# Patient Record
Sex: Female | Born: 1975 | Race: White | Hispanic: No | Smoking: Current every day smoker
Health system: Southern US, Community
[De-identification: ages and names within clinical notes are randomized; demographics above are authoritative.]

## PROBLEM LIST (undated history)

## (undated) DIAGNOSIS — J449 Chronic obstructive pulmonary disease, unspecified: Secondary | ICD-10-CM

## (undated) DIAGNOSIS — Z720 Tobacco use: Secondary | ICD-10-CM

## (undated) HISTORY — PX: ABDOMINAL HYSTERECTOMY: SUR658

---

## 2018-03-30 ENCOUNTER — Ambulatory Visit: Payer: Self-pay | Admitting: Osteopathic Medicine

## 2018-04-12 ENCOUNTER — Ambulatory Visit: Payer: Self-pay | Admitting: Osteopathic Medicine

## 2018-04-12 ENCOUNTER — Telehealth: Payer: Self-pay | Admitting: Osteopathic Medicine

## 2018-04-12 NOTE — Telephone Encounter (Signed)
Patient canceled same day on previous new to establish visit on 03/30/2018.  Today also no-show to establish care with Dr. Lyn Hollingshead, 04/12/18. If late or no-show again, will not accept patient to this clinic. Please call her to reschedule visit and inform them of this policy.

## 2018-04-25 ENCOUNTER — Ambulatory Visit: Payer: Self-pay | Admitting: Osteopathic Medicine

## 2018-05-03 ENCOUNTER — Ambulatory Visit: Payer: Self-pay | Admitting: Osteopathic Medicine

## 2018-08-09 ENCOUNTER — Ambulatory Visit: Payer: Self-pay | Admitting: Osteopathic Medicine

## 2020-06-23 ENCOUNTER — Encounter (HOSPITAL_COMMUNITY): Payer: Self-pay | Admitting: Pediatrics

## 2020-06-23 ENCOUNTER — Inpatient Hospital Stay (HOSPITAL_COMMUNITY)
Admission: EM | Admit: 2020-06-23 | Discharge: 2020-06-26 | DRG: 193 | Disposition: A | Discharge status: Home Health | Payer: Self-pay | Attending: Family Medicine | Admitting: Family Medicine

## 2020-06-23 ENCOUNTER — Emergency Department (HOSPITAL_COMMUNITY): Payer: Self-pay

## 2020-06-23 ENCOUNTER — Other Ambulatory Visit: Payer: Self-pay

## 2020-06-23 DIAGNOSIS — F17210 Nicotine dependence, cigarettes, uncomplicated: Secondary | ICD-10-CM | POA: Diagnosis present

## 2020-06-23 DIAGNOSIS — R0602 Shortness of breath: Secondary | ICD-10-CM

## 2020-06-23 DIAGNOSIS — J9601 Acute respiratory failure with hypoxia: Secondary | ICD-10-CM | POA: Diagnosis present

## 2020-06-23 DIAGNOSIS — Z20822 Contact with and (suspected) exposure to covid-19: Secondary | ICD-10-CM | POA: Diagnosis present

## 2020-06-23 DIAGNOSIS — R0789 Other chest pain: Secondary | ICD-10-CM | POA: Diagnosis present

## 2020-06-23 DIAGNOSIS — Z720 Tobacco use: Secondary | ICD-10-CM

## 2020-06-23 DIAGNOSIS — J101 Influenza due to other identified influenza virus with other respiratory manifestations: Principal | ICD-10-CM | POA: Diagnosis present

## 2020-06-23 DIAGNOSIS — J441 Chronic obstructive pulmonary disease with (acute) exacerbation: Secondary | ICD-10-CM | POA: Diagnosis present

## 2020-06-23 DIAGNOSIS — R079 Chest pain, unspecified: Secondary | ICD-10-CM

## 2020-06-23 DIAGNOSIS — Z825 Family history of asthma and other chronic lower respiratory diseases: Secondary | ICD-10-CM

## 2020-06-23 DIAGNOSIS — J209 Acute bronchitis, unspecified: Secondary | ICD-10-CM | POA: Diagnosis present

## 2020-06-23 DIAGNOSIS — Z803 Family history of malignant neoplasm of breast: Secondary | ICD-10-CM

## 2020-06-23 DIAGNOSIS — J440 Chronic obstructive pulmonary disease with acute lower respiratory infection: Secondary | ICD-10-CM | POA: Diagnosis present

## 2020-06-23 DIAGNOSIS — J45901 Unspecified asthma with (acute) exacerbation: Secondary | ICD-10-CM | POA: Diagnosis present

## 2020-06-23 DIAGNOSIS — Z801 Family history of malignant neoplasm of trachea, bronchus and lung: Secondary | ICD-10-CM

## 2020-06-23 DIAGNOSIS — Z23 Encounter for immunization: Secondary | ICD-10-CM

## 2020-06-23 HISTORY — DX: Tobacco use: Z72.0

## 2020-06-23 HISTORY — DX: Chronic obstructive pulmonary disease, unspecified: J44.9

## 2020-06-23 LAB — COMPREHENSIVE METABOLIC PANEL
ALT: 35 U/L (ref 0–44)
AST: 46 U/L — ABNORMAL HIGH (ref 15–41)
Albumin: 3.9 g/dL (ref 3.5–5.0)
Alkaline Phosphatase: 42 U/L (ref 38–126)
Anion gap: 11 (ref 5–15)
BUN: 6 mg/dL (ref 6–20)
CO2: 28 mmol/L (ref 22–32)
Calcium: 9 mg/dL (ref 8.9–10.3)
Chloride: 98 mmol/L (ref 98–111)
Creatinine, Ser: 0.57 mg/dL (ref 0.44–1.00)
GFR, Estimated: >60 mL/min (ref >60)
Glucose, Bld: 135 mg/dL — ABNORMAL HIGH (ref 70–99)
Potassium: 4.3 mmol/L (ref 3.5–5.1)
Sodium: 137 mmol/L (ref 135–145)
Total Bilirubin: 0.3 mg/dL (ref 0.3–1.2)
Total Protein: 7.6 g/dL (ref 6.5–8.1)

## 2020-06-23 LAB — CBC WITH DIFFERENTIAL/PLATELET
Abs Immature Granulocytes: 0.03 10*3/uL (ref 0.00–0.07)
Basophils Absolute: 0 10*3/uL (ref 0.0–0.1)
Basophils Relative: 0 %
Eosinophils Absolute: 0 10*3/uL (ref 0.0–0.5)
Eosinophils Relative: 0 %
HCT: 43.7 % (ref 36.0–46.0)
Hemoglobin: 14.6 g/dL (ref 12.0–15.0)
Immature Granulocytes: 0 %
Lymphocytes Relative: 13 %
Lymphs Abs: 0.9 10*3/uL (ref 0.7–4.0)
MCH: 33.3 pg (ref 26.0–34.0)
MCHC: 33.4 g/dL (ref 30.0–36.0)
MCV: 99.8 fL (ref 80.0–100.0)
Monocytes Absolute: 0.4 10*3/uL (ref 0.1–1.0)
Monocytes Relative: 5 %
Neutro Abs: 5.6 10*3/uL (ref 1.7–7.7)
Neutrophils Relative %: 82 %
Platelets: 158 10*3/uL (ref 150–400)
RBC: 4.38 MIL/uL (ref 3.87–5.11)
RDW: 12.1 % (ref 11.5–15.5)
WBC: 7 10*3/uL (ref 4.0–10.5)
nRBC: 0 % (ref 0.0–0.2)

## 2020-06-23 LAB — TROPONIN I (HIGH SENSITIVITY): Troponin I (High Sensitivity): 4 ng/L (ref <18)

## 2020-06-23 MED ORDER — NICOTINE 7 MG/24HR TD PT24
7.0000 mg | MEDICATED_PATCH | Freq: Once | TRANSDERMAL | Status: DC
  Administered 2020-06-24: 7 mg via TRANSDERMAL
  Filled 2020-06-23: qty 1

## 2020-06-23 MED ORDER — IPRATROPIUM-ALBUTEROL 0.5-2.5 (3) MG/3ML IN SOLN: 3.0000 mL | Freq: Once | RESPIRATORY_TRACT | Status: DC

## 2020-06-23 MED ORDER — ALBUTEROL SULFATE HFA 108 (90 BASE) MCG/ACT IN AERS
6.0000 | INHALATION_SPRAY | Freq: Once | RESPIRATORY_TRACT | Status: AC
  Administered 2020-06-23: 6 via RESPIRATORY_TRACT
  Filled 2020-06-23: qty 6.7

## 2020-06-23 MED ORDER — SODIUM CHLORIDE 0.9 % IV SOLN
500.0000 mg | Freq: Once | INTRAVENOUS | Status: AC
  Administered 2020-06-24: 500 mg via INTRAVENOUS
  Filled 2020-06-23: qty 500

## 2020-06-23 MED ORDER — METHYLPREDNISOLONE SODIUM SUCC 125 MG IJ SOLR
125.0000 mg | Freq: Once | INTRAMUSCULAR | Status: AC
  Administered 2020-06-23: 125 mg via INTRAVENOUS
  Filled 2020-06-23: qty 2

## 2020-06-23 MED ORDER — MAGNESIUM SULFATE 2 GM/50ML IV SOLN
2.0000 g | Freq: Once | INTRAVENOUS | Status: AC
  Administered 2020-06-23: 2 g via INTRAVENOUS
  Filled 2020-06-23: qty 50

## 2020-06-23 NOTE — ED Provider Notes (Signed)
[facility] EMERGENCY DEPARTMENT Provider Note   CSN: [id number] Arrival date & time: 06/23/20  1509     History Chief Complaint  Patient presents with  . Shortness of Breath    KEYLEEN CERRATO is a 44 y.o. female with a history of COPD presents with worsening shortness of breath over the past 4 days. Patient states that she had a URI last week and was seen at the [facility] ED 4 days ago and discharged with prednisone burst and an inhaler. States she was diagnosed with COPD at that ED visit. Had increased beta production and cough over the past couple days, no fever. Is not on oxygen at home was noted to be hypoxic in triage, placed on 2 L nasal cannula.  The history is provided by the patient.  Shortness of Breath Severity:  Moderate Onset quality:  Gradual Duration:  4 days Timing:  Constant Progression:  Worsening Chronicity:  New Context: URI   Relieved by:  Rest Worsened by:  Activity and exertion Ineffective treatments:  Inhaler Associated symptoms: chest pain, cough, sputum production and wheezing   Associated symptoms: no abdominal pain, no ear pain, no fever, no headaches, no rash, no sore throat and no vomiting   Risk factors: no hx of PE/DVT, no obesity, no oral contraceptive use, no prolonged immobilization and no recent surgery        Past Medical History:  Diagnosis Date  . COPD (chronic obstructive pulmonary disease) (HCC)     There are no problems to display for this patient.    OB History   No obstetric history on file.     No family history on file.  Social History   Tobacco Use  . Smoking status: Not on file  Substance Use Topics  . Alcohol use: Not on file  . Drug use: Not on file    Home Medications Prior to Admission medications   Not on File    Allergies    Patient has no known allergies.  Review of Systems   Review of Systems  Constitutional: Negative for chills and fever.  HENT: Negative for ear  pain and sore throat.   Eyes: Negative for pain and visual disturbance.  Respiratory: Positive for cough, sputum production, shortness of breath and wheezing.   Cardiovascular: Positive for chest pain. Negative for palpitations.  Gastrointestinal: Negative for abdominal pain and vomiting.  Genitourinary: Negative for dysuria and hematuria.  Musculoskeletal: Negative for arthralgias and back pain.  Skin: Negative for color change and rash.  Neurological: Negative for seizures, syncope and headaches.  All other systems reviewed and are negative.   Physical Exam Updated Vital Signs BP 133/87 (BP Location: Right Arm)   Pulse 81   Temp 98.6 F (37 C) (Oral)   Resp (!) 35   SpO2 93%   Physical Exam Vitals and nursing note reviewed.  Constitutional:      General: She is not in acute distress.    Appearance: She is well-developed. She is not ill-appearing or toxic-appearing.  HENT:     Head: Normocephalic and atraumatic.  Eyes:     Conjunctiva/sclera: Conjunctivae normal.  Cardiovascular:     Rate and Rhythm: Normal rate and regular rhythm.     Heart sounds: No murmur heard.   Pulmonary:     Effort: Accessory muscle usage present. No respiratory distress.     Breath sounds: Decreased breath sounds and wheezing present. No rhonchi.  Abdominal:     Palpations: Abdomen is  soft.     Tenderness: There is no abdominal tenderness.  Musculoskeletal:        General: Normal range of motion.     Cervical back: Neck supple.     Right lower leg: No tenderness. No edema.     Left lower leg: No tenderness. No edema.  Skin:    General: Skin is warm and dry.  Neurological:     Mental Status: She is alert and oriented to person, place, and time.  Psychiatric:        Mood and Affect: Mood normal.        Behavior: Behavior normal.     ED Results / Procedures / Treatments   Labs (all labs ordered are listed, but only abnormal results are displayed) Labs Reviewed  COMPREHENSIVE METABOLIC  PANEL - Abnormal; Notable for the following components:      Result Value   Glucose, Bld 135 (*)    AST 46 (*)    All other components within normal limits  RESPIRATORY PANEL BY RT PCR (FLU A&B, COVID)  CBC WITH DIFFERENTIAL/PLATELET  I-STAT BETA HCG BLOOD, ED ([facility], [facility], [facility] ONLY)  TROPONIN I (HIGH SENSITIVITY)    EKG EKG Interpretation  Date/Time:  Monday June 23 2020 15:13:32 EST Ventricular Rate:  88 PR Interval:  154 QRS Duration: 78 QT Interval:  358 QTC Calculation: 433 R Axis:   91 Text Interpretation: Sinus rhythm with Premature atrial complexes with Abberant conduction Right atrial enlargement Rightward axis Pulmonary disease pattern Nonspecific T wave abnormality Abnormal ECG No prior ECG for comparison. ARtifact present. No STEMI Confirmed by Theda Belfast ([id number]) on 06/23/2020 9:55:38 PM   Radiology DG Chest 2 View  Result Date: 06/23/2020 CLINICAL DATA:  Wheezing and shortness of breath for 3 days, history COPD EXAM: CHEST - 2 VIEW COMPARISON:  None FINDINGS: Normal heart size, mediastinal contours, and pulmonary vascularity. Hyperinflation with mild emphysematous changes and minimal central peribronchial thickening consistent with COPD. No acute infiltrate, pleural effusion, or pneumothorax. Osseous structures unremarkable. IMPRESSION: COPD changes. No acute abnormalities. Electronically Signed   By: Ulyses Southward M.D.   On: 06/23/2020 16:06    Procedures Procedures (including critical care time)  Medications Ordered in ED Medications  methylPREDNISolone sodium succinate (SOLU-MEDROL) 125 mg/2 mL injection 125 mg (has no administration in time range)  ipratropium-albuterol (DUONEB) 0.5-2.5 (3) MG/3ML nebulizer solution 3 mL (has no administration in time range)  ipratropium-albuterol (DUONEB) 0.5-2.5 (3) MG/3ML nebulizer solution 3 mL (has no administration in time range)  ipratropium-albuterol (DUONEB) 0.5-2.5 (3) MG/3ML nebulizer solution 3 mL (has no  administration in time range)  albuterol (VENTOLIN HFA) 108 (90 Base) MCG/ACT inhaler 6 puff (has no administration in time range)  magnesium sulfate IVPB 2 g 50 mL (has no administration in time range)    ED Course  I have reviewed the triage vital signs and the nursing notes.  Pertinent labs & imaging results that were available during my care of the patient were reviewed by me and considered in my medical decision making (see chart for details).    MDM Rules/Calculators/A&P                          MDM: Tabria Steines is a 44 y.o. female who presents with shortness of breath as per above. I have reviewed the nursing documentation for past medical history, family history, and social history. Pertinent previous records reviewed. She is awake, alert. HDS on 2 L  nasal cannula, satting 93%. Afebrile. Physical exam is most notable for decreased breath sounds and wheezing bilaterally.  Labs: CBC and CMP unremarkable.  Initial troponin 4, repeat pending.  D-dimer and Covid pending at time of handoff. Hcg pending. EKG: NSR. QTc, PR, and QRS within appropriate limits. No signs of acute ischemia, infarct, or significant electrical abnormalities. No STEMI, ST depressions, or significant T wave inversions. No evidence of a High-Grade Conduction Block, WPW, Brugada Sign, ARVC, DeWinters T Waves, or Wellens Waves. Imaging: CXR demonstrating hyperinflation but no opacities concerning for pneumonia. Consults: none Tx: 6 puffs albuterol, 125 mg Solu-Medrol, 2 g mag, 500 mg IV as a throw, nicotine patch.  Holding DuoNeb at this time as COVID pending  Differential Dx: I am most concerned for COPD exacerbation. PE, pneumonia possible.Given history, physical exam, and work-up, I do not think she has pneumothorax, esophageal pathology, symptomatic anemia, arrhythmia, or trauma.  MDM: NALINA YEATMAN is a 44 y.o. female presents with worsening shortness of breath.  Patient was diagnosed with COPD at the ED 4  days ago per patient and was discharged with prednisone burst and albuterol inhaler. Patient has recent URI and has had increased sputum production so we will treat as COPD exacerbation with antibiotics.  Patient hypoxic and having chest pain so concerned for PE, moderate risk by Wells criteria so did obtain D-dimer which is pending at time of handoff.  Low concern for ACS given initial troponin of 4.  Has a new oxygen requirement and will require admission.  Tight breath sounds bilaterally so spoke with RT who will do 6 puffs albuterol will plan to do continuous presents duo nebs if Covid results negative.  Transfer of care at 0000 to oncoming team. Assessment and plan of care communicated including follow-up D-dimer for need for CT PE, follow-up,, and admit. Patient in stable condition at time of transfer.   The plan for this patient was discussed with Dr. Rush Landmark, who voiced agreement and who oversaw evaluation and treatment of this patient.   Final Clinical Impression(s) / ED Diagnoses Final diagnoses:  None    Rx / DC Orders ED Discharge Orders    None       Gershon Mussel, MD 06/24/20 0037    Tegeler, Canary Brim, MD 06/25/20 1130

## 2020-06-23 NOTE — ED Notes (Signed)
Pt ambulatory to RR with 1 assist/O2 tank

## 2020-06-23 NOTE — ED Triage Notes (Signed)
Patient stated recently diagnosed with COPD; c/o shortness of breath worst w/ exertion and unrelieved by steroids and inhaler. Noted room air Sp02 initially at low 80s, improved w/ rest and supplemental 02 at 2L via [place]. Patient able to finish full sentences with some breathing difficult, encourage patient to rest, stated feels better.

## 2020-06-24 ENCOUNTER — Encounter (HOSPITAL_COMMUNITY): Payer: Self-pay | Admitting: Internal Medicine

## 2020-06-24 DIAGNOSIS — Z720 Tobacco use: Secondary | ICD-10-CM

## 2020-06-24 DIAGNOSIS — J101 Influenza due to other identified influenza virus with other respiratory manifestations: Secondary | ICD-10-CM

## 2020-06-24 DIAGNOSIS — J9601 Acute respiratory failure with hypoxia: Secondary | ICD-10-CM

## 2020-06-24 DIAGNOSIS — J441 Chronic obstructive pulmonary disease with (acute) exacerbation: Secondary | ICD-10-CM

## 2020-06-24 DIAGNOSIS — J45901 Unspecified asthma with (acute) exacerbation: Secondary | ICD-10-CM

## 2020-06-24 DIAGNOSIS — R079 Chest pain, unspecified: Secondary | ICD-10-CM

## 2020-06-24 DIAGNOSIS — J209 Acute bronchitis, unspecified: Secondary | ICD-10-CM

## 2020-06-24 LAB — RESPIRATORY PANEL BY RT PCR (FLU A&B, COVID)
Influenza A by PCR: POSITIVE — AB
Influenza B by PCR: NEGATIVE
SARS Coronavirus 2 by RT PCR: NEGATIVE

## 2020-06-24 LAB — D-DIMER, QUANTITATIVE: D-Dimer, Quant: 0.37 ug/mL-FEU (ref 0.00–0.50)

## 2020-06-24 LAB — TROPONIN I (HIGH SENSITIVITY): Troponin I (High Sensitivity): 3 ng/L

## 2020-06-24 MED ORDER — LORAZEPAM 0.5 MG PO TABS: 0.5000 mg | ORAL_TABLET | Freq: Once | ORAL | Status: DC | PRN

## 2020-06-24 MED ORDER — TRAMADOL HCL 50 MG PO TABS
50.0000 mg | ORAL_TABLET | Freq: Four times a day (QID) | ORAL | Status: DC | PRN
  Administered 2020-06-24 – 2020-06-26 (×3): 50 mg via ORAL
  Filled 2020-06-24 (×3): qty 1

## 2020-06-24 MED ORDER — HYDROCOD POLST-CPM POLST ER 10-8 MG/5ML PO SUER
5.0000 mL | Freq: Two times a day (BID) | ORAL | Status: DC
  Administered 2020-06-24 – 2020-06-26 (×5): 5 mL via ORAL
  Filled 2020-06-24 (×5): qty 5

## 2020-06-24 MED ORDER — DEXTROSE 5 % IV SOLN: 250.0000 mg | INTRAVENOUS | Status: DC

## 2020-06-24 MED ORDER — LORAZEPAM 1 MG PO TABS
0.5000 mg | ORAL_TABLET | Freq: Once | ORAL | Status: AC | PRN
  Administered 2020-06-24: 0.5 mg via ORAL
  Filled 2020-06-24: qty 1

## 2020-06-24 MED ORDER — METHYLPREDNISOLONE SODIUM SUCC 40 MG IJ SOLR
40.0000 mg | Freq: Two times a day (BID) | INTRAMUSCULAR | Status: DC
  Administered 2020-06-25 – 2020-06-26 (×4): 40 mg via INTRAVENOUS
  Filled 2020-06-24 (×4): qty 1

## 2020-06-24 MED ORDER — MOMETASONE FURO-FORMOTEROL FUM 200-5 MCG/ACT IN AERO
2.0000 | INHALATION_SPRAY | Freq: Two times a day (BID) | RESPIRATORY_TRACT | Status: DC
  Administered 2020-06-24: 2 via RESPIRATORY_TRACT
  Filled 2020-06-24: qty 8.8

## 2020-06-24 MED ORDER — NICOTINE 14 MG/24HR TD PT24
14.0000 mg | MEDICATED_PATCH | Freq: Every day | TRANSDERMAL | Status: DC
  Administered 2020-06-24 – 2020-06-26 (×3): 14 mg via TRANSDERMAL
  Filled 2020-06-24 (×3): qty 1

## 2020-06-24 MED ORDER — METHYLPREDNISOLONE SODIUM SUCC 125 MG IJ SOLR
60.0000 mg | Freq: Four times a day (QID) | INTRAMUSCULAR | Status: DC
  Administered 2020-06-24 (×2): 60 mg via INTRAVENOUS
  Filled 2020-06-24 (×2): qty 2

## 2020-06-24 MED ORDER — SALINE SPRAY 0.65 % NA SOLN
1.0000 | NASAL | Status: DC | PRN
  Filled 2020-06-24: qty 44

## 2020-06-24 MED ORDER — ENOXAPARIN SODIUM 40 MG/0.4ML ~~LOC~~ SOLN
40.0000 mg | SUBCUTANEOUS | Status: DC
  Filled 2020-06-24 (×3): qty 0.4

## 2020-06-24 MED ORDER — IPRATROPIUM-ALBUTEROL 0.5-2.5 (3) MG/3ML IN SOLN
3.0000 mL | Freq: Four times a day (QID) | RESPIRATORY_TRACT | Status: DC
  Administered 2020-06-24 (×3): 3 mL via RESPIRATORY_TRACT
  Filled 2020-06-24 (×4): qty 3

## 2020-06-24 MED ORDER — IPRATROPIUM-ALBUTEROL 0.5-2.5 (3) MG/3ML IN SOLN
3.0000 mL | Freq: Three times a day (TID) | RESPIRATORY_TRACT | Status: DC
  Administered 2020-06-25 – 2020-06-26 (×4): 3 mL via RESPIRATORY_TRACT
  Filled 2020-06-24 (×5): qty 3

## 2020-06-24 MED ORDER — ALBUTEROL SULFATE (2.5 MG/3ML) 0.083% IN NEBU: 2.5000 mg | INHALATION_SOLUTION | RESPIRATORY_TRACT | Status: DC | PRN

## 2020-06-24 MED ORDER — INFLUENZA VAC SPLIT QUAD 0.5 ML IM SUSY
0.5000 mL | PREFILLED_SYRINGE | INTRAMUSCULAR | Status: AC
  Administered 2020-06-26: 0.5 mL via INTRAMUSCULAR
  Filled 2020-06-24: qty 0.5

## 2020-06-24 MED ORDER — BUDESONIDE 0.25 MG/2ML IN SUSP
0.2500 mg | Freq: Two times a day (BID) | RESPIRATORY_TRACT | Status: DC
  Administered 2020-06-24: 0.25 mg via RESPIRATORY_TRACT
  Filled 2020-06-24: qty 2

## 2020-06-24 MED ORDER — OSELTAMIVIR PHOSPHATE 75 MG PO CAPS
75.0000 mg | ORAL_CAPSULE | Freq: Once | ORAL | Status: AC
  Administered 2020-06-24: 75 mg via ORAL
  Filled 2020-06-24: qty 1

## 2020-06-24 MED ORDER — LORAZEPAM 0.5 MG PO TABS
0.5000 mg | ORAL_TABLET | Freq: Once | ORAL | Status: AC | PRN
  Administered 2020-06-24: 0.5 mg via ORAL
  Filled 2020-06-24: qty 1

## 2020-06-24 MED ORDER — ACETAMINOPHEN 325 MG PO TABS
650.0000 mg | ORAL_TABLET | Freq: Four times a day (QID) | ORAL | Status: DC | PRN
  Administered 2020-06-26: 650 mg via ORAL
  Filled 2020-06-24: qty 2

## 2020-06-24 MED ORDER — OSELTAMIVIR PHOSPHATE 75 MG PO CAPS
75.0000 mg | ORAL_CAPSULE | Freq: Two times a day (BID) | ORAL | Status: DC
  Administered 2020-06-24 – 2020-06-26 (×5): 75 mg via ORAL
  Filled 2020-06-24 (×8): qty 1

## 2020-06-24 MED ORDER — GUAIFENESIN-DM 100-10 MG/5ML PO SYRP
5.0000 mL | ORAL_SOLUTION | ORAL | Status: DC | PRN
  Administered 2020-06-25 (×2): 5 mL via ORAL
  Filled 2020-06-24 (×4): qty 5

## 2020-06-24 MED ORDER — ACETAMINOPHEN 650 MG RE SUPP: 650.0000 mg | Freq: Four times a day (QID) | RECTAL | Status: DC | PRN

## 2020-06-24 NOTE — Progress Notes (Addendum)
PROGRESS NOTE    Janet Pitts  DTO:[id number] DOB: [birth year] DOA: 06/23/2020 PCP: Patient, No Pcp Per    Brief Narrative:  Janet Pitts was admitted to the hospital with a working diagnosis of acute hypoxic respiratory failure due asthma exacerbation/ acute bronchitis due acute influenza A infection.  44 year old female with significant past medical history of tobacco abuse and suspected COPD who presents with 5 days of dyspnea, associated with productive cough and pleuritic chest pain.  Her symptoms were refractive to outpatient management with bronchodilators and prednisone.  On her initial physical examination her oximetry was in the low 80s on room air, respiratory rate 28, blood pressure 132/90, heart rate 83, she was in respiratory distress, increased work of breathing, bilateral wheezing and decreased air movement, heart S1-S2, present rhythmic, abdomen soft and nontender, no lower extremity edema. Sodium 137, potassium 4.6, chloride 98, bicarb 28, glucose 135, BUN 6, creatinine 0.57, white count 7.0, hemoglobin 14.6, hematocrit 43.7, platelets 158.  SARS COVID-19 negative, influenza A positive, influenza B negative. Chest radiograph with positive hyperinflation, no infiltrates. EKG 88 bpm, normal axis, normal intervals, sinus rhythm, no ST segment T wave changes, noisy baseline.    Assessment & Plan:   Principal Problem:   Acute hypoxemic respiratory failure (HCC) Active Problems:   Influenza A virus present   Chest pain   Tobacco use   Acute bronchitis   Asthma exacerbation in COPD (HCC)   1. Acute hypoxemic respiratory failure due to acute bronchitis due to influenza A infection.  Oxygenation is 97% on 4 L./min per [place], at the time of my examination with no significant wheezing.   Continue supportive medical therapy with aggressive bronchodilatory therapy with albuterol/ ipratropium and antitussive agents. Antiviral therapy with Oseltamivir for 5 days. Discontinue  azithromycin.   Continue with systemic and inhaled corticosteroids. Decrease methylprednisolone to 40 mg IV bid. Chest pain control with acetaminophen and tramadol.  Airway clearing techniques with flutter valve and incentive spirometer.  Out of bed to chair tid with meals.  2. Tobacco abuse. Continue smoking cessation counseling     Patient continue to be at high risk for worsening respiratory failure   Status is: Inpatient  Remains inpatient appropriate because:IV treatments appropriate due to intensity of illness or inability to take PO   Dispo: The patient is from: Home              Anticipated d/c is to: Home              Anticipated d/c date is: 2 days              Patient currently is not medically stable to d/c.    DVT prophylaxis: Enoxaparin   Code Status:   full  Family Communication:  No family at the bedside         Subjective: Patient with persistent dyspnea, worse with exertion, continue to have cough and pleuritic chest pain, no nausea or vomiting.   Objective: Vitals:   06/24/20 0831 06/24/20 0929 06/24/20 0938 06/24/20 0955  BP: 121/72  (!) 144/83 (!) 112/54  Pulse: (!) 59 63 62 (!) 55  Resp: (!) 23 20 18 20   Temp:  98 F (36.7 C) 97.6 F (36.4 C) 98.3 F (36.8 C)  TempSrc:   Oral Oral  SpO2:  98% 99% 97%    Intake/Output Summary (Last 24 hours) at 06/24/2020 1230 Last data filed at 06/24/2020 0118 Gross per 24 hour  Intake 250 ml  Output --  Net 250 ml   There were no vitals filed for this visit.  Examination:   General: Not in pain or dyspnea,. Deconditioned  Neurology: Awake and alert, non focal  E ENT: no pallor, no icterus, oral mucosa moist Cardiovascular: No JVD. S1-S2 present, rhythmic, no gallops, rubs, or murmurs. No lower extremity edema. Pulmonary: positive breath sounds bilaterally,decreased air movement, no wheezing or rhonchi, but scattered rales. Gastrointestinal. Abdomen soft and non tender Skin. No  rashes Musculoskeletal: no joint deformities     Data Reviewed: I have personally reviewed following labs and imaging studies  CBC: Recent Labs  Lab 06/23/20 1528  WBC 7.0  NEUTROABS 5.6  HGB 14.6  HCT 43.7  MCV 99.8  PLT 158   Basic Metabolic Panel: Recent Labs  Lab 06/23/20 1528  NA 137  K 4.3  CL 98  CO2 28  GLUCOSE 135*  BUN 6  CREATININE 0.57  CALCIUM 9.0   GFR: CrCl cannot be calculated (Unknown ideal weight.). Liver Function Tests: Recent Labs  Lab 06/23/20 1528  AST 46*  ALT 35  ALKPHOS 42  BILITOT 0.3  PROT 7.6  ALBUMIN 3.9   No results for input(s): LIPASE, AMYLASE in the last 168 hours. No results for input(s): AMMONIA in the last 168 hours. Coagulation Profile: No results for input(s): INR, PROTIME in the last 168 hours. Cardiac Enzymes: No results for input(s): CKTOTAL, CKMB, CKMBINDEX, TROPONINI in the last 168 hours. BNP (last 3 results) No results for input(s): PROBNP in the last 8760 hours. HbA1C: No results for input(s): HGBA1C in the last 72 hours. CBG: No results for input(s): GLUCAP in the last 168 hours. Lipid Profile: No results for input(s): CHOL, HDL, LDLCALC, TRIG, CHOLHDL, LDLDIRECT in the last 72 hours. Thyroid Function Tests: No results for input(s): TSH, T4TOTAL, FREET4, T3FREE, THYROIDAB in the last 72 hours. Anemia Panel: No results for input(s): VITAMINB12, FOLATE, FERRITIN, TIBC, IRON, RETICCTPCT in the last 72 hours.    Radiology Studies: I have reviewed all of the imaging during this hospital visit personally     Scheduled Meds: . budesonide (PULMICORT) nebulizer solution  0.25 mg Nebulization BID  . enoxaparin (LOVENOX) injection  40 mg Subcutaneous Q24H  . [START ON 06/25/2020] influenza vac split quadrivalent PF  0.5 mL Intramuscular Tomorrow-1000  . ipratropium-albuterol  3 mL Nebulization Q6H  . methylPREDNISolone (SOLU-MEDROL) injection  60 mg Intravenous Q6H  . nicotine  14 mg Transdermal Daily   . oseltamivir  75 mg Oral BID   Continuous Infusions: . azithromycin       LOS: 0 days        Janet Pitts Janet Gula, MD

## 2020-06-24 NOTE — ED Provider Notes (Signed)
Assumed care from afternoon team.  See prior notes for full H&P.  Briefly, 44 y.o. F here with SOB.  Diagnosed with COPD recently at OSH but reports worsening symptoms.  New oxygen requirement today, currently on 2L.  Labs grossly reassuring.  CXR with COPD changes.  Given solumedrol and magnesium and albuterol inhaler while covid test pending.  Plan:  RT panel, d-dimer pending.  Admit for new O2 requirement.  Results for orders placed or performed during the hospital encounter of 06/23/20  Respiratory Panel by RT PCR (Flu A&B, Covid) - Nasopharyngeal Swab   Specimen: Nasopharyngeal Swab; Nasopharyngeal(NP) swabs in vial transport medium  Result Value Ref Range   SARS Coronavirus 2 by RT PCR NEGATIVE NEGATIVE   Influenza A by PCR POSITIVE (A) NEGATIVE   Influenza B by PCR NEGATIVE NEGATIVE  CBC with Differential  Result Value Ref Range   WBC 7.0 4.0 - 10.5 K/uL   RBC 4.38 3.87 - 5.11 MIL/uL   Hemoglobin 14.6 12.0 - 15.0 g/dL   HCT 67.5 36 - 46 %   MCV 99.8 80.0 - 100.0 fL   MCH 33.3 26.0 - 34.0 pg   MCHC 33.4 30.0 - 36.0 g/dL   RDW 91.6 38.4 - 66.5 %   Platelets 158 150 - 400 K/uL   nRBC 0.0 0.0 - 0.2 %   Neutrophils Relative % 82 %   Neutro Abs 5.6 1.7 - 7.7 K/uL   Lymphocytes Relative 13 %   Lymphs Abs 0.9 0.7 - 4.0 K/uL   Monocytes Relative 5 %   Monocytes Absolute 0.4 0.1 - 1.0 K/uL   Eosinophils Relative 0 %   Eosinophils Absolute 0.0 0.0 - 0.5 K/uL   Basophils Relative 0 %   Basophils Absolute 0.0 0.0 - 0.1 K/uL   Immature Granulocytes 0 %   Abs Immature Granulocytes 0.03 0.00 - 0.07 K/uL  Comprehensive metabolic panel  Result Value Ref Range   Sodium 137 135 - 145 mmol/L   Potassium 4.3 3.5 - 5.1 mmol/L   Chloride 98 98 - 111 mmol/L   CO2 28 22 - 32 mmol/L   Glucose, Bld 135 (H) 70 - 99 mg/dL   BUN 6 6 - 20 mg/dL   Creatinine, Ser 9.93 0.44 - 1.00 mg/dL   Calcium 9.0 8.9 - 57.0 mg/dL   Total Protein 7.6 6.5 - 8.1 g/dL   Albumin 3.9 3.5 - 5.0 g/dL   AST 46 (H) 15  - 41 U/L   ALT 35 0 - 44 U/L   Alkaline Phosphatase 42 38 - 126 U/L   Total Bilirubin 0.3 0.3 - 1.2 mg/dL   GFR, Estimated >17 >79 mL/min   Anion gap 11 5 - 15  D-dimer, quantitative (not at [facility])  Result Value Ref Range   D-Dimer, Quant 0.37 0.00 - 0.50 ug/mL-FEU  Troponin I (High Sensitivity)  Result Value Ref Range   Troponin I (High Sensitivity) 4 <18 ng/L   DG Chest 2 View  Result Date: 06/23/2020 CLINICAL DATA:  Wheezing and shortness of breath for 3 days, history COPD EXAM: CHEST - 2 VIEW COMPARISON:  None FINDINGS: Normal heart size, mediastinal contours, and pulmonary vascularity. Hyperinflation with mild emphysematous changes and minimal central peribronchial thickening consistent with COPD. No acute infiltrate, pleural effusion, or pneumothorax. Osseous structures unremarkable. IMPRESSION: COPD changes. No acute abnormalities. Electronically Signed   By: Ulyses Southward M.D.   On: 06/23/2020 16:06   Covid test is negative, however positive for influenza A.  D-dimer is also negative.  Patient was informed of her results.  Given that Covid is negative, can now move forward with neb treatments.  Will give dose of Tamiflu and plan to admit.  Discussed with hospitalist, Dr. Loney Loh-- will admit for ongoing care.   Garlon Hatchet, PA-C 06/24/20 0210    Shon Baton, MD 06/24/20 [phone]

## 2020-06-24 NOTE — ED Notes (Signed)
MD paged regarding pt's request for PRN anxiolytic

## 2020-06-24 NOTE — H&P (Signed)
History and Physical    Janet Pitts IWP:[id number] DOB: 10/29/1975 DOA: 06/23/2020  PCP: Patient, No Pcp Per Patient coming from: Home  Chief Complaint: Shortness of breath  HPI: Janet Pitts is a 44 y.o. female with medical history significant of tobacco use, recently diagnosed COPD presenting with a chief complaint of shortness of breath.  Patient states her symptoms started about 5 days ago.  She is having a lot of shortness of breath which is worse with even minimal exertion.  She is coughing up thick yellow sputum.  She has had some chest discomfort when she feels short of breath or takes deep breaths.  States she went to [facility] ED after her symptoms started and was diagnosed with COPD and discharged on prednisone and albuterol inhaler.  Her symptoms persisted despite using these medications.  Reports smoking 1/2 to 1 pack of cigarettes daily for the past 25 years.  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  Denies current illicit drug use.  She has been vaccinated against Covid ([employer] vaccine).  She usually does not take the flu vaccine.  ED Course: Afebrile.  Tachypneic.  SPO2 in the low 80s on room air at triage, improved with rest and 2 L supplemental oxygen via nasal cannula.  WBC 7.0, hemoglobin 14.6, hematocrit 43.7, platelet 158K.  Sodium 137, potassium 4.3, chloride 98, bicarb 28, BUN 6, creatinine 0.5, glucose 135.  Initial high-sensitivity troponin negative, repeat pending.  D-dimer within normal range.  SARS-CoV-2 PCR test negative.  Influenza A PCR positive.  Chest x-ray showing changes consistent with COPD and no acute abnormalities.  Medications administered include 6 puffs of albuterol inhaler, Solu-Medrol 125 mg, Tamiflu 75 mg, azithromycin, IV magnesium 2 g, and nicotine patch.  Review of Systems:  All systems reviewed and apart from history of presenting illness, are negative.  Past Medical History:  Diagnosis Date   COPD  (chronic obstructive pulmonary disease) (HCC)    Tobacco abuse     Past Surgical History:  Procedure Laterality Date   ABDOMINAL HYSTERECTOMY       reports that she has been smoking cigarettes. She has a 25.00 pack-year smoking history. She has never used smokeless tobacco. She reports current alcohol use. She reports previous drug use. Drugs: IV and Heroin.  No Known Allergies  Family History  Problem Relation Age of Onset   Breast cancer Mother    Emphysema Maternal Grandfather    Lung cancer Paternal Grandmother     Prior to Admission medications   Not on File    Physical Exam: Vitals:   06/24/20 0145 06/24/20 0200 06/24/20 0230 06/24/20 0300  BP: 132/90 136/70 124/78 131/63  Pulse: 83 83 76 72  Resp: (!) 27 (!) 28 (!) 24 (!) 21  Temp:      TempSrc:      SpO2: 94% 92% 97% 93%    Physical Exam Constitutional:      Appearance: She is not diaphoretic.  HENT:     Head: Normocephalic and atraumatic.  Eyes:     Extraocular Movements: Extraocular movements intact.     Conjunctiva/sclera: Conjunctivae normal.  Cardiovascular:     Rate and Rhythm: Normal rate and regular rhythm.     Pulses: Normal pulses.  Pulmonary:     Effort: Respiratory distress present.     Breath sounds: Wheezing present.     Comments: Tachypneic with respiratory rate up to 30 Decreased air entry bilaterally with diffuse end  expiratory wheezing. Abdominal:     General: Bowel sounds are normal. There is no distension.     Palpations: Abdomen is soft.     Tenderness: There is no abdominal tenderness.  Musculoskeletal:        General: No swelling or tenderness.     Cervical back: Normal range of motion and neck supple.  Skin:    General: Skin is warm and dry.  Neurological:     General: No focal deficit present.     Mental Status: She is alert and oriented to person, place, and time.     Labs on Admission: I have personally reviewed following labs and imaging studies  CBC: Recent  Labs  Lab 06/23/20 1528  WBC 7.0  NEUTROABS 5.6  HGB 14.6  HCT 43.7  MCV 99.8  PLT 158   Basic Metabolic Panel: Recent Labs  Lab 06/23/20 1528  NA 137  K 4.3  CL 98  CO2 28  GLUCOSE 135*  BUN 6  CREATININE 0.57  CALCIUM 9.0   GFR: CrCl cannot be calculated (Unknown ideal weight.). Liver Function Tests: Recent Labs  Lab 06/23/20 1528  AST 46*  ALT 35  ALKPHOS 42  BILITOT 0.3  PROT 7.6  ALBUMIN 3.9   No results for input(s): LIPASE, AMYLASE in the last 168 hours. No results for input(s): AMMONIA in the last 168 hours. Coagulation Profile: No results for input(s): INR, PROTIME in the last 168 hours. Cardiac Enzymes: No results for input(s): CKTOTAL, CKMB, CKMBINDEX, TROPONINI in the last 168 hours. BNP (last 3 results) No results for input(s): PROBNP in the last 8760 hours. HbA1C: No results for input(s): HGBA1C in the last 72 hours. CBG: No results for input(s): GLUCAP in the last 168 hours. Lipid Profile: No results for input(s): CHOL, HDL, LDLCALC, TRIG, CHOLHDL, LDLDIRECT in the last 72 hours. Thyroid Function Tests: No results for input(s): TSH, T4TOTAL, FREET4, T3FREE, THYROIDAB in the last 72 hours. Anemia Panel: No results for input(s): VITAMINB12, FOLATE, FERRITIN, TIBC, IRON, RETICCTPCT in the last 72 hours. Urine analysis: No results found for: COLORURINE, APPEARANCEUR, LABSPEC, PHURINE, GLUCOSEU, HGBUR, BILIRUBINUR, KETONESUR, PROTEINUR, UROBILINOGEN, NITRITE, LEUKOCYTESUR  Radiological Exams on Admission: DG Chest 2 View  Result Date: 06/23/2020 CLINICAL DATA:  Wheezing and shortness of breath for 3 days, history COPD EXAM: CHEST - 2 VIEW COMPARISON:  None FINDINGS: Normal heart size, mediastinal contours, and pulmonary vascularity. Hyperinflation with mild emphysematous changes and minimal central peribronchial thickening consistent with COPD. No acute infiltrate, pleural effusion, or pneumothorax. Osseous structures unremarkable. IMPRESSION:  COPD changes. No acute abnormalities. Electronically Signed   By: Ulyses Southward M.D.   On: 06/23/2020 16:06    EKG: Independently reviewed.  Sinus rhythm, artifact.  No STEMI.  Assessment/Plan Principal Problem:   COPD with acute exacerbation (HCC) Active Problems:   Acute hypoxemic respiratory failure (HCC)   Influenza A virus present   Chest pain   Tobacco use   Acute hypoxemic respiratory failure secondary to acute COPD exacerbation and influenza A infection: Patient is presenting with complaints of shortness of breath and cough.  She has a longstanding history of cigarette smoking and chest x-ray showing findings consistent with COPD and no acute abnormalities.  Tachypneic and wheezing on exam.  Influenza A PCR positive.  SARS-CoV-2 PCR test negative.  SPO2 in the low 80s on room air at triage, currently requiring 2 L supplemental oxygen to maintain sats in the mid 90s. -Continue Solu-Medrol 60 mg every 6 hours, DuoNebs every  6 hours, albuterol nebulizer as needed, Pulmicort nebulizer twice daily, azithromycin, and Tamiflu 75 mg twice daily.  Continuous pulse ox.  Supplemental oxygen, wean as tolerated.  Droplet precautions.  Chest pain: Likely related to bronchospasm.  EKG not suggestive of STEMI.  High-sensitivity troponin negative x2.  PE less likely as D-dimer within normal range. -Cardiac monitoring.  Continue treatment for COPD exacerbation and monitor.  Tobacco use: Reports smoking 1/2 to 1 pack of cigarettes daily for the past 25 years. -NicoDerm patch and counseled to quit.  DVT prophylaxis: Lovenox Code Status: Full code Family Communication: No family available this time. Disposition Plan: Status is: Inpatient  Remains inpatient appropriate because:IV treatments appropriate due to intensity of illness or inability to take PO, Inpatient level of care appropriate due to severity of illness and New supplemental oxygen requirement   Dispo: The patient is from: Home               Anticipated d/c is to: Home              Anticipated d/c date is: 3 days              Patient currently is not medically stable to d/c.  The medical decision making on this patient was of high complexity and the patient is at high risk for clinical deterioration, therefore this is a level 3 visit.  John Giovanni MD [facility]  If 7PM-7AM, please contact night-coverage [email or web address]  06/24/2020, 3:10 AM

## 2020-06-25 LAB — BASIC METABOLIC PANEL
Anion gap: 9 (ref 5–15)
BUN: 12 mg/dL (ref 6–20)
CO2: 30 mmol/L (ref 22–32)
Calcium: 9.2 mg/dL (ref 8.9–10.3)
Chloride: 99 mmol/L (ref 98–111)
Creatinine, Ser: 0.68 mg/dL (ref 0.44–1.00)
GFR, Estimated: >60 mL/min (ref >60)
Glucose, Bld: 119 mg/dL — ABNORMAL HIGH (ref 70–99)
Potassium: 4.3 mmol/L (ref 3.5–5.1)
Sodium: 138 mmol/L (ref 135–145)

## 2020-06-25 LAB — HIV ANTIBODY (ROUTINE TESTING W REFLEX): HIV Screen 4th Generation wRfx: NONREACTIVE

## 2020-06-25 MED ORDER — LORAZEPAM 0.5 MG PO TABS
0.5000 mg | ORAL_TABLET | Freq: Every day | ORAL | Status: DC
  Administered 2020-06-25: 0.5 mg via ORAL
  Filled 2020-06-25: qty 1

## 2020-06-25 NOTE — Progress Notes (Signed)
[facility] PROGRESS NOTE    SHERIE DOBROWOLSKI  YJE:[id number] DOB: 1976-01-02 DOA: 06/23/2020 PCP: Patient, No Pcp Per      Brief Narrative:  Ms Uhrich is a 44 y.o. F with COPD and smoking who presented with severe dyspnea.    Found to have influenza A, COPD flare.      Assessment & Plan:  Acute hypoxemic respiratory failure due to influenza A and superimposed COPD flare Improving, but still dyspneic with ambulation just a few feet.  Still on oxygen today. -Continue Tamiflu -Continue Solu-Medrol -Continue frequent bronchodilators -Wean oxygen as able   Smoking Smoke cessation recommended, modalities discussed        Disposition: Status is: Inpatient  Remains inpatient appropriate because:Inpatient level of care appropriate due to severity of illness   Dispo: The patient is from: Home              Anticipated d/c is to: Home              Anticipated d/c date is: 1 day              Patient currently is not medically stable to d/c.     The patient was admitted with influenza and COPD exacerbation.  She has made some improvement, but is still severely dyspneic, unable to care for self at home due to her degree of illness.  Continue Tamiflu, Solu-Medrol, frequent bronchodilators, oxygen and wean as able.         MDM: The below labs and imaging reports were reviewed and summarized above.  Medication management as above.     DVT prophylaxis: enoxaparin (LOVENOX) injection 40 mg Start: 06/24/20 1000  Code Status: Full code Family Communication: None present            Subjective: Still dyspneic, just with walking to the bathroom.  Unable to even take a shower due to dyspnea.  Leaning forward due to dyspnea, and sounds out of breath with conversation.  No sputum, no hemoptysis, no chest pain.  No confusion.  Objective: Vitals:   06/24/20 2059 06/25/20 0417 06/25/20 1326 06/25/20 1508  BP:  117/66  106/72  Pulse:  67   63  Resp:  18    Temp:  98.3 F (36.8 C)  98.1 F (36.7 C)  TempSrc:  Oral    SpO2: 98% 99% 91% 94%  Weight:      Height:        Intake/Output Summary (Last 24 hours) at 06/25/2020 1748 Last data filed at 06/24/2020 2300 Gross per 24 hour  Intake 240 ml  Output --  Net 240 ml   Filed Weights   06/24/20 1628  Weight: 51.8 kg    Examination: General appearance:  adult female, alert and in no obvious distress.   HEENT: Anicteric, conjunctiva pink, lids and lashes normal. No nasal deformity, discharge, epistaxis.  Lips moist.   Skin: Warm and dry.  No jaundice.  No suspicious rashes or lesions. Cardiac: RRR, nl S1-S2, no murmurs appreciated.  Capillary refill is brisk.  JVP normal.  No LE edema.  Radial pulses 2+ and symmetric. Respiratory: Respiratory rate is increased, she is leaning forward in bed, to improve her breathing, she sounds out of breath, her lung sounds are is very diminished bilaterally there is expiratory wheezing.. Abdomen: Abdomen soft.  No TTP or guarding. No ascites, distension, hepatosplenomegaly.   MSK: No deformities or effusions. Neuro: Awake and alert.  EOMI, moves all extremities. Speech fluent.  Psych: Sensorium intact and responding to questions, attention normal. Affect normal.  Judgment and insight appear normal.    Data Reviewed: I have personally reviewed following labs and imaging studies:  CBC: Recent Labs  Lab 06/23/20 1528  WBC 7.0  NEUTROABS 5.6  HGB 14.6  HCT 43.7  MCV 99.8  PLT 158   Basic Metabolic Panel: Recent Labs  Lab 06/23/20 1528 06/25/20 0234  NA 137 138  K 4.3 4.3  CL 98 99  CO2 28 30  GLUCOSE 135* 119*  BUN 6 12  CREATININE 0.57 0.68  CALCIUM 9.0 9.2   GFR: Estimated Creatinine Clearance: 74.9 mL/min (by C-G formula based on SCr of 0.68 mg/dL). Liver Function Tests: Recent Labs  Lab 06/23/20 1528  AST 46*  ALT 35  ALKPHOS 42  BILITOT 0.3  PROT 7.6  ALBUMIN 3.9   No results for input(s): LIPASE,  AMYLASE in the last 168 hours. No results for input(s): AMMONIA in the last 168 hours. Coagulation Profile: No results for input(s): INR, PROTIME in the last 168 hours. Cardiac Enzymes: No results for input(s): CKTOTAL, CKMB, CKMBINDEX, TROPONINI in the last 168 hours. BNP (last 3 results) No results for input(s): PROBNP in the last 8760 hours. HbA1C: No results for input(s): HGBA1C in the last 72 hours. CBG: No results for input(s): GLUCAP in the last 168 hours. Lipid Profile: No results for input(s): CHOL, HDL, LDLCALC, TRIG, CHOLHDL, LDLDIRECT in the last 72 hours. Thyroid Function Tests: No results for input(s): TSH, T4TOTAL, FREET4, T3FREE, THYROIDAB in the last 72 hours. Anemia Panel: No results for input(s): VITAMINB12, FOLATE, FERRITIN, TIBC, IRON, RETICCTPCT in the last 72 hours. Urine analysis: No results found for: COLORURINE, APPEARANCEUR, LABSPEC, PHURINE, GLUCOSEU, HGBUR, BILIRUBINUR, KETONESUR, PROTEINUR, UROBILINOGEN, NITRITE, LEUKOCYTESUR Sepsis Labs: @LABRCNTIP (procalcitonin:4,lacticacidven:4)  ) Recent Results (from the past 240 hour(s))  Respiratory Panel by RT PCR (Flu A&B, Covid) - Nasopharyngeal Swab     Status: Abnormal   Collection Time: 06/23/20 10:41 PM   Specimen: Nasopharyngeal Swab; Nasopharyngeal(NP) swabs in vial transport medium  Result Value Ref Range Status   SARS Coronavirus 2 by RT PCR NEGATIVE NEGATIVE Final    Comment: (NOTE) SARS-CoV-2 target nucleic acids are NOT DETECTED.  The SARS-CoV-2 RNA is generally detectable in upper respiratoy specimens during the acute phase of infection. The lowest concentration of SARS-CoV-2 viral copies this assay can detect is 131 copies/mL. A negative result does not preclude SARS-Cov-2 infection and should not be used as the sole basis for treatment or other patient management decisions. A negative result may occur with  improper specimen collection/handling, submission of specimen other than  nasopharyngeal swab, presence of viral mutation(s) within the areas targeted by this assay, and inadequate number of viral copies (<131 copies/mL). A negative result must be combined with clinical observations, patient history, and epidemiological information. The expected result is Negative.  Fact Sheet for Patients:  06/25/20  Fact Sheet for Healthcare Providers:  https://www.moore.com/  This test is no t yet approved or cleared by the https://www.young.biz/ FDA and  has been authorized for detection and/or diagnosis of SARS-CoV-2 by FDA under an Emergency Use Authorization (EUA). This EUA will remain  in effect (meaning this test can be used) for the duration of the COVID-19 declaration under Section 564(b)(1) of the Act, 21 U.S.C. section 360bbb-3(b)(1), unless the authorization is terminated or revoked sooner.     Influenza A by PCR POSITIVE (A) NEGATIVE Final   Influenza B by PCR NEGATIVE NEGATIVE Final  Comment: (NOTE) The Xpert Xpress SARS-CoV-2/FLU/RSV assay is intended as an aid in  the diagnosis of influenza from Nasopharyngeal swab specimens and  should not be used as a sole basis for treatment. Nasal washings and  aspirates are unacceptable for Xpert Xpress SARS-CoV-2/FLU/RSV  testing.  Fact Sheet for Patients: https://www.moore.com/  Fact Sheet for Healthcare Providers: https://www.young.biz/  This test is not yet approved or cleared by the [place] FDA and  has been authorized for detection and/or diagnosis of SARS-CoV-2 by  FDA under an Emergency Use Authorization (EUA). This EUA will remain  in effect (meaning this test can be used) for the duration of the  Covid-19 declaration under Section 564(b)(1) of the Act, 21  U.S.C. section 360bbb-3(b)(1), unless the authorization is  terminated or revoked. Performed at [facility], [address], [place],  [place] [address]          Radiology Studies: No results found.      Scheduled Meds: . chlorpheniramine-HYDROcodone  5 mL Oral Q12H  . enoxaparin (LOVENOX) injection  40 mg Subcutaneous Q24H  . influenza vac split quadrivalent PF  0.5 mL Intramuscular Tomorrow-1000  . ipratropium-albuterol  3 mL Nebulization TID  . methylPREDNISolone (SOLU-MEDROL) injection  40 mg Intravenous Q12H  . mometasone-formoterol  2 puff Inhalation BID  . nicotine  14 mg Transdermal Daily  . oseltamivir  75 mg Oral BID   Continuous Infusions:   LOS: 1 day    Time spent: 25 minutes    Alberteen Sam, MD [facility] 06/25/2020, 5:48 PM     Please page though AMION or Epic secure chat:  For [employer], Higher education careers adviser

## 2020-06-26 MED ORDER — ALBUTEROL SULFATE (2.5 MG/3ML) 0.083% IN NEBU: 2.5000 mg | INHALATION_SOLUTION | RESPIRATORY_TRACT | 2 refills | Status: AC | PRN

## 2020-06-26 MED ORDER — OSELTAMIVIR PHOSPHATE 75 MG PO CAPS
75.0000 mg | ORAL_CAPSULE | Freq: Two times a day (BID) | ORAL | 0 refills | Status: DC
Start: 2020-06-26 — End: 2020-06-26

## 2020-06-26 MED ORDER — OSELTAMIVIR PHOSPHATE 75 MG PO CAPS: 75.0000 mg | ORAL_CAPSULE | Freq: Two times a day (BID) | ORAL | 0 refills | Status: AC

## 2020-06-26 MED ORDER — GUAIFENESIN-DM 100-10 MG/5ML PO SYRP: 5.0000 mL | ORAL_SOLUTION | ORAL | 0 refills | Status: AC | PRN

## 2020-06-26 MED ORDER — PREDNISONE 20 MG PO TABS: 40.0000 mg | ORAL_TABLET | Freq: Every day | ORAL | 0 refills | Status: AC

## 2020-06-26 MED ORDER — HYDROCOD POLST-CPM POLST ER 10-8 MG/5ML PO SUER: 5.0000 mL | Freq: Two times a day (BID) | ORAL | 0 refills | Status: AC | PRN

## 2020-06-26 NOTE — Discharge Summary (Signed)
Physician Discharge Summary  ULONDA KLOSOWSKI TDV:[id number] DOB: 09-15-1975 DOA: 06/23/2020  PCP: Patient, No Pcp Per  Admit date: 06/23/2020 Discharge date: 06/26/2020  Admitted From: Home  Disposition:  Home   Recommendations for Outpatient Follow-up:  1. Follow up with new PCP as soon as able      Home Health: None  Equipment/Devices: Nebulizer  Discharge Condition: Fair  CODE STATUS: FULL Diet recommendation: Regular  Brief/Interim Summary: Ms Rochel is a 44 y.o. F with COPD and smoking who presented with severe dyspnea.    In the ER, SpO2 85% on room air and wheezing.  Influenza A positive.  CXR without opacities, hyperexpanded consistent with possible COPD.  Started on steroids, Tamiflu, bronchodilators and admitted to hospitalist service.        PRINCIPAL HOSPITAL DIAGNOSIS: Acute hypoxic respiratory failure due to Influenza A and superimposed COPD flare    Discharge Diagnoses:  Acute hypoxemic respiratory failure due to influenza A and superimposed COPD flare Patient admitted and started on Tamiflu, bronchodilators, and antibiotics.    Continue Tamiflu and steroids to complete 5 days.      Smoking Smoke cessation recommended, modalities discussed              Discharge Instructions  Discharge Instructions    Diet - low sodium heart healthy   Complete by: As directed    Discharge instructions   Complete by: As directed    From Dr. Maryfrances Bunnell: You were admitted with the flu, plus probably a COPD flare.  For the flu, you were treated with Tamiflu/oseltamivir Take oseltamivir for the next 2 days, starting tonight  For the COPD flare, you were treated with steroids and albuterol Take prednisone 40 mg (two tabs) once daily in th emorning for two more days Use the albuterol 3-4 times per day for the next week, then you can back out to as needed use  Go see a primary care doctor; the best clinic to schedule with is the [facility] (listed below in To Do section), call them on Monday   Avoid use of Tussionex while driving Keep this away from children and dispose of leftover medication, this is an opiate and has a risk of overdose and death   For home use only DME Nebulizer machine   Complete by: As directed    Patient needs a nebulizer to treat with the following condition: COPD (chronic obstructive pulmonary disease) (HCC)   Length of Need: Lifetime   Increase activity slowly   Complete by: As directed      Allergies as of 06/26/2020   No Known Allergies     Medication List    STOP taking these medications   guaiFENesin 600 MG 12 hr tablet Commonly known as: MUCINEX     TAKE these medications   acetaminophen 500 MG tablet Commonly known as: TYLENOL Take 1,000 mg by mouth every 6 (six) hours as needed for mild pain.   albuterol 108 (90 Base) MCG/ACT inhaler Commonly known as: VENTOLIN HFA Inhale 2 puffs into the lungs every 6 (six) hours as needed for wheezing or shortness of breath. What changed: Another medication with the same name was added. Make sure you understand how and when to take each.   albuterol (2.5 MG/3ML) 0.083% nebulizer solution Commonly known as: PROVENTIL Take 3 mLs (2.5 mg total) by nebulization every 4 (four) hours as needed for wheezing or shortness of breath. What changed: You were already taking a medication with the  same name, and this prescription was added. Make sure you understand how and when to take each.   chlorpheniramine-HYDROcodone 10-8 MG/5ML Suer Commonly known as: TUSSIONEX Take 5 mLs by mouth every 12 (twelve) hours as needed for cough.   guaiFENesin-dextromethorphan 100-10 MG/5ML syrup Commonly known as: ROBITUSSIN DM Take 5 mLs by mouth every 4 (four) hours as needed for cough.   nicotine 21 mg/24hr patch Commonly known as: NICODERM CQ - dosed in mg/24 hours Place 21 mg onto the skin daily.   oseltamivir 75 MG capsule Commonly known as:  TAMIFLU Take 1 capsule (75 mg total) by mouth 2 (two) times daily.   predniSONE 20 MG tablet Commonly known as: DELTASONE Take 2 tablets (40 mg total) by mouth daily with breakfast. What changed: additional instructions            Durable Medical Equipment  (From admission, onward)         Start     Ordered   06/26/20 0000  For home use only DME Nebulizer machine       Question Answer Comment  Patient needs a nebulizer to treat with the following condition COPD (chronic obstructive pulmonary disease) (HCC)   Length of Need Lifetime      06/26/20 1137          Follow-up Information    [facility]. Call.   Why: call monday to schedulae an appointment asap Contact information: [address] [facility] [address] [phone]             No Known Allergies    Procedures/Studies: DG Chest 2 View  Result Date: 06/23/2020 CLINICAL DATA:  Wheezing and shortness of breath for 3 days, history COPD EXAM: CHEST - 2 VIEW COMPARISON:  None FINDINGS: Normal heart size, mediastinal contours, and pulmonary vascularity. Hyperinflation with mild emphysematous changes and minimal central peribronchial thickening consistent with COPD. No acute infiltrate, pleural effusion, or pneumothorax. Osseous structures unremarkable. IMPRESSION: COPD changes. No acute abnormalities. Electronically Signed   By: Ulyses Southward M.D.   On: 06/23/2020 16:06      Subjective: Still weak and SOB, but improved.  Able to walk around the room into the bathroom without difficulty.  No longer on oxygen.  No confusion, fever, sputum.  Discharge Exam: Vitals:   06/26/20 0500 06/26/20 0800  BP: 111/66   Pulse: (!) 53   Resp: 17   Temp: 98.5 F (36.9 C)   SpO2: 95% 94%   Vitals:   06/25/20 2036 06/25/20 2100 06/26/20 0500 06/26/20 0800  BP:  122/73 111/66   Pulse:  65 (!) 53   Resp:  18 17   Temp:  98.7 F (37.1 C) 98.5 F (36.9 C)   TempSrc:   Oral Oral   SpO2: 95% 93% 95% 94%  Weight:      Height:        General: Pt is alert, awake, not in acute distress Cardiovascular: RRR, nl S1-S2, no murmurs appreciated.   No LE edema.   Respiratory: Normal respiratory rate and rhythm.  CTAB without rales or wheezes.  Lung sounds very diminished however. Abdominal: Abdomen soft and non-tender.  No distension or HSM.   Neuro/Psych: Strength symmetric in upper and lower extremities.  Judgment and insight appear normal.   The results of significant diagnostics from this hospitalization (including imaging, microbiology, ancillary and laboratory) are listed below for reference.     Microbiology: Recent Results (from the past 240 hour(s))  Respiratory Panel by RT PCR (Flu A&B, Covid) - Nasopharyngeal Swab     Status: Abnormal   Collection Time: 06/23/20 10:41 PM   Specimen: Nasopharyngeal Swab; Nasopharyngeal(NP) swabs in vial transport medium  Result Value Ref Range Status   SARS Coronavirus 2 by RT PCR NEGATIVE NEGATIVE Final    Comment: (NOTE) SARS-CoV-2 target nucleic acids are NOT DETECTED.  The SARS-CoV-2 RNA is generally detectable in upper respiratoy specimens during the acute phase of infection. The lowest concentration of SARS-CoV-2 viral copies this assay can detect is 131 copies/mL. A negative result does not preclude SARS-Cov-2 infection and should not be used as the sole basis for treatment or other patient management decisions. A negative result may occur with  improper specimen collection/handling, submission of specimen other than nasopharyngeal swab, presence of viral mutation(s) within the areas targeted by this assay, and inadequate number of viral copies (<131 copies/mL). A negative result must be combined with clinical observations, patient history, and epidemiological information. The expected result is Negative.  Fact Sheet for Patients:  https://www.moore.com/  Fact Sheet for Healthcare  Providers:  https://www.young.biz/  This test is no t yet approved or cleared by the [place] FDA and  has been authorized for detection and/or diagnosis of SARS-CoV-2 by FDA under an Emergency Use Authorization (EUA). This EUA will remain  in effect (meaning this test can be used) for the duration of the COVID-19 declaration under Section 564(b)(1) of the Act, 21 U.S.C. section 360bbb-3(b)(1), unless the authorization is terminated or revoked sooner.     Influenza A by PCR POSITIVE (A) NEGATIVE Final   Influenza B by PCR NEGATIVE NEGATIVE Final    Comment: (NOTE) The Xpert Xpress SARS-CoV-2/FLU/RSV assay is intended as an aid in  the diagnosis of influenza from Nasopharyngeal swab specimens and  should not be used as a sole basis for treatment. Nasal washings and  aspirates are unacceptable for Xpert Xpress SARS-CoV-2/FLU/RSV  testing.  Fact Sheet for Patients: https://www.moore.com/  Fact Sheet for Healthcare Providers: https://www.young.biz/  This test is not yet approved or cleared by the [place] FDA and  has been authorized for detection and/or diagnosis of SARS-CoV-2 by  FDA under an Emergency Use Authorization (EUA). This EUA will remain  in effect (meaning this test can be used) for the duration of the  Covid-19 declaration under Section 564(b)(1) of the Act, 21  U.S.C. section 360bbb-3(b)(1), unless the authorization is  terminated or revoked. Performed at [facility], [address], [place], [place] [address]      Labs: BNP (last 3 results) No results for input(s): BNP in the last 8760 hours. Basic Metabolic Panel: Recent Labs  Lab 06/23/20 1528 06/25/20 0234  NA 137 138  K 4.3 4.3  CL 98 99  CO2 28 30  GLUCOSE 135* 119*  BUN 6 12  CREATININE 0.57 0.68  CALCIUM 9.0 9.2   Liver Function Tests: Recent Labs  Lab 06/23/20 1528  AST 46*  ALT 35  ALKPHOS 42  BILITOT 0.3   PROT 7.6  ALBUMIN 3.9   No results for input(s): LIPASE, AMYLASE in the last 168 hours. No results for input(s): AMMONIA in the last 168 hours. CBC: Recent Labs  Lab 06/23/20 1528  WBC 7.0  NEUTROABS 5.6  HGB 14.6  HCT 43.7  MCV 99.8  PLT 158   Cardiac Enzymes: No results for input(s): CKTOTAL, CKMB, CKMBINDEX, TROPONINI in the last 168 hours. BNP: Invalid input(s): POCBNP CBG: No results for input(s):  GLUCAP in the last 168 hours. D-Dimer Recent Labs    06/24/20 0020  DDIMER 0.37   Hgb A1c No results for input(s): HGBA1C in the last 72 hours. Lipid Profile No results for input(s): CHOL, HDL, LDLCALC, TRIG, CHOLHDL, LDLDIRECT in the last 72 hours. Thyroid function studies No results for input(s): TSH, T4TOTAL, T3FREE, THYROIDAB in the last 72 hours.  Invalid input(s): FREET3 Anemia work up No results for input(s): VITAMINB12, FOLATE, FERRITIN, TIBC, IRON, RETICCTPCT in the last 72 hours. Urinalysis No results found for: COLORURINE, APPEARANCEUR, LABSPEC, PHURINE, GLUCOSEU, HGBUR, BILIRUBINUR, KETONESUR, PROTEINUR, UROBILINOGEN, NITRITE, LEUKOCYTESUR Sepsis Labs Invalid input(s): PROCALCITONIN,  WBC,  LACTICIDVEN Microbiology Recent Results (from the past 240 hour(s))  Respiratory Panel by RT PCR (Flu A&B, Covid) - Nasopharyngeal Swab     Status: Abnormal   Collection Time: 06/23/20 10:41 PM   Specimen: Nasopharyngeal Swab; Nasopharyngeal(NP) swabs in vial transport medium  Result Value Ref Range Status   SARS Coronavirus 2 by RT PCR NEGATIVE NEGATIVE Final    Comment: (NOTE) SARS-CoV-2 target nucleic acids are NOT DETECTED.  The SARS-CoV-2 RNA is generally detectable in upper respiratoy specimens during the acute phase of infection. The lowest concentration of SARS-CoV-2 viral copies this assay can detect is 131 copies/mL. A negative result does not preclude SARS-Cov-2 infection and should not be used as the sole basis for treatment or other patient  management decisions. A negative result may occur with  improper specimen collection/handling, submission of specimen other than nasopharyngeal swab, presence of viral mutation(s) within the areas targeted by this assay, and inadequate number of viral copies (<131 copies/mL). A negative result must be combined with clinical observations, patient history, and epidemiological information. The expected result is Negative.  Fact Sheet for Patients:  https://www.moore.com/https://www.fda.gov/media/142436/download  Fact Sheet for Healthcare Providers:  https://www.young.biz/https://www.fda.gov/media/142435/download  This test is no t yet approved or cleared by the [place] FDA and  has been authorized for detection and/or diagnosis of SARS-CoV-2 by FDA under an Emergency Use Authorization (EUA). This EUA will remain  in effect (meaning this test can be used) for the duration of the COVID-19 declaration under Section 564(b)(1) of the Act, 21 U.S.C. section 360bbb-3(b)(1), unless the authorization is terminated or revoked sooner.     Influenza A by PCR POSITIVE (A) NEGATIVE Final   Influenza B by PCR NEGATIVE NEGATIVE Final    Comment: (NOTE) The Xpert Xpress SARS-CoV-2/FLU/RSV assay is intended as an aid in  the diagnosis of influenza from Nasopharyngeal swab specimens and  should not be used as a sole basis for treatment. Nasal washings and  aspirates are unacceptable for Xpert Xpress SARS-CoV-2/FLU/RSV  testing.  Fact Sheet for Patients: https://www.moore.com/https://www.fda.gov/media/142436/download  Fact Sheet for Healthcare Providers: https://www.young.biz/https://www.fda.gov/media/142435/download  This test is not yet approved or cleared by the [place] FDA and  has been authorized for detection and/or diagnosis of SARS-CoV-2 by  FDA under an Emergency Use Authorization (EUA). This EUA will remain  in effect (meaning this test can be used) for the duration of the  Covid-19 declaration under Section 564(b)(1) of the Act, 21  U.S.C. section  360bbb-3(b)(1), unless the authorization is  terminated or revoked. Performed at [facility], [address] [place], [place] [address]      Time coordinating discharge: 25 minutes The [place] controlled substances registry was reviewed for this patient prior to filling the <5 days supply controlled substances script.      SIGNED:   Alberteen Samhristopher P Norvella Loscalzo, MD  [facility]  06/26/2020, 12:04 PM

## 2020-06-26 NOTE — TOC Progression Note (Signed)
Transition of Care [facility]) - Progression Note    Patient Details  Name: Janet Pitts MRN: [id number] Date of Birth: [birth year]  Transition of Care [facility]) CM/SW Contact  Lockie Pares, RN Phone Number: 06/26/2020, 10:57 AM  Clinical Narrative:    Called DME to obtain nebulizer for home use charity. Called DME, they are only on call today and may not be able to procure one today. They will call me back.    Expected Discharge Plan: Home w Home Health Services Barriers to Discharge: Continued Medical Work up  Expected Discharge Plan and Services Expected Discharge Plan: Home w Home Health Services   Discharge Planning Services: CM Consult   Living arrangements for the past 2 months: Single Family Home Expected Discharge Date: 06/26/20                                     Social Determinants of Health (SDOH) Interventions    Readmission Risk Interventions No flowsheet data found.

## 2020-06-26 NOTE — Plan of Care (Signed)

## 2020-06-26 NOTE — TOC Initial Note (Signed)
Transition of Care [facility]) - Initial/Assessment Note    Patient Details  Name: Janet Pitts MRN: [id number] Date of Birth: [birth year]  Transition of Care [facility]) CM/SW Contact:    Lockie Pares, RN Phone Number: 06/26/2020, 9:47 AM  Clinical Narrative:                  Patient in with respiratory distress, undiagnosed likely COPD and Flu A.  SHOB, oxygen levels low 80's on room air, now on 4LPM. On tamiflu and steroids. Negative for COVID.  May require home oxygen , patient does not have PCP, referred to [facility], she will have to call first thing Monday for appointment. Due to the fact that she does not have a PCP and no insurance, she would not be eligible for home health, however if needed she could get [facility] charity temporarily and charity oxygen and DME  CM will follow for needs Current Facility-Administered Medications  Medication Dose Route Frequency Provider Last Rate Last Admin  . acetaminophen (TYLENOL) tablet 650 mg  650 mg Oral Q6H PRN John Giovanni, MD   650 mg at 06/26/20 0849   Or  . acetaminophen (TYLENOL) suppository 650 mg  650 mg Rectal Q6H PRN John Giovanni, MD      . albuterol (PROVENTIL) (2.5 MG/3ML) 0.083% nebulizer solution 2.5 mg  2.5 mg Nebulization Q4H PRN John Giovanni, MD      . chlorpheniramine-HYDROcodone (TUSSIONEX) 10-8 MG/5ML suspension 5 mL  5 mL Oral Q12H Arrien, York Ram, MD   5 mL at 06/26/20 0848  . enoxaparin (LOVENOX) injection 40 mg  40 mg Subcutaneous Q24H John Giovanni, MD      . guaiFENesin-dextromethorphan (ROBITUSSIN DM) 100-10 MG/5ML syrup 5 mL  5 mL Oral Q4H PRN Arrien, York Ram, MD   5 mL at 06/25/20 2307  . influenza vac split quadrivalent PF (FLUARIX) injection 0.5 mL  0.5 mL Intramuscular Tomorrow-1000 Arrien, York Ram, MD      . ipratropium-albuterol (DUONEB) 0.5-2.5 (3) MG/3ML nebulizer solution 3 mL  3 mL Nebulization TID Arrien, York Ram, MD   3 mL at 06/26/20  0759  . LORazepam (ATIVAN) tablet 0.5 mg  0.5 mg Oral QHS Chotiner, Claudean Severance, MD   0.5 mg at 06/25/20 2301  . methylPREDNISolone sodium succinate (SOLU-MEDROL) 40 mg/mL injection 40 mg  40 mg Intravenous Q12H Arrien, York Ram, MD   40 mg at 06/25/20 2300  . nicotine (NICODERM CQ - dosed in mg/24 hours) patch 14 mg  14 mg Transdermal Daily John Giovanni, MD   14 mg at 06/26/20 0851  . oseltamivir (TAMIFLU) capsule 75 mg  75 mg Oral BID John Giovanni, MD   75 mg at 06/26/20 0901  . sodium chloride (OCEAN) 0.65 % nasal spray 1 spray  1 spray Each Nare PRN Arrien, York Ram, MD      . traMADol Janean Sark) tablet 50 mg  50 mg Oral Q6H PRN Arrien, York Ram, MD   50 mg at 06/25/20 0945   will .  Expected Discharge Plan: Home w Home Health Services Barriers to Discharge: Continued Medical Work up   Patient Goals and CMS Choice        Expected Discharge Plan and Services Expected Discharge Plan: Home w Home Health Services   Discharge Planning Services: CM Consult   Living arrangements for the past 2 months: Single Family Home Expected Discharge Date: 06/26/20  Prior Living Arrangements/Services Living arrangements for the past 2 months: Single Family Home Lives with:: Significant Other Patient language and need for interpreter reviewed:: Yes        Need for Family Participation in Patient Care: Yes (Comment) Care giver support system in place?: Yes (comment)   Criminal Activity/Legal Involvement Pertinent to Current Situation/Hospitalization: No - Comment as needed  Activities of Daily Living Home Assistive Devices/Equipment: None ADL Screening (condition at time of admission) Patient's cognitive ability adequate to safely complete daily activities?: Yes Is the patient deaf or have difficulty hearing?: No Does the patient have difficulty seeing, even when wearing glasses/contacts?: No Does the patient have  difficulty concentrating, remembering, or making decisions?: No Patient able to express need for assistance with ADLs?: No Does the patient have difficulty dressing or bathing?: No Independently performs ADLs?: Yes (appropriate for developmental age) Does the patient have difficulty walking or climbing stairs?: Yes (due to current condition) Weakness of Legs: None Weakness of Arms/Hands: None  Permission Sought/Granted                  Emotional Assessment       Orientation: : Oriented to Situation, Oriented to  Time, Oriented to Place, Oriented to Self Alcohol / Substance Use: Tobacco Use Psych Involvement: No (comment)  Admission diagnosis:  SOB (shortness of breath) [R06.02] COPD with acute exacerbation (HCC) [J44.1] Patient Active Problem List   Diagnosis Date Noted  . COPD with acute exacerbation (HCC) 06/24/2020  . Acute hypoxemic respiratory failure (HCC) 06/24/2020  . Influenza A virus present 06/24/2020  . Chest pain 06/24/2020  . Tobacco use 06/24/2020  . Acute bronchitis 06/24/2020  . Asthma exacerbation in COPD (HCC) 06/24/2020   PCP:  Patient, No Pcp Per Pharmacy:   [facility] [address] [place] - [address] [place] [address] Phone: [phone] Fax: [phone]     Social Determinants of Health (SDOH) Interventions    Readmission Risk Interventions No flowsheet data found.

## 2020-06-26 NOTE — TOC Transition Note (Signed)
Transition of Care [facility]) - CM/SW Discharge Note   Patient Details  Name: Janet Pitts MRN: [id number] Date of Birth: 11-Sep-1975  Transition of [facility]) CM/SW Contact:  Lockie Pares, RN Phone Number: 06/26/2020, 11:26 AM   Clinical Narrative:     Patient being discharged today , [facility] cannot provide nebulizer , spoke to patient, she can obtain nebulizer from pharmacy as long as she had a script. Dr Maryfrances Bunnell notifiedia secure chat.      Barriers to Discharge: Continued Medical Work up   Patient Goals and CMS Choice        Discharge Placement                       Discharge Plan and Services   Discharge Planning Services: CM Consult                                 Social Determinants of Health (SDOH) Interventions     Readmission Risk Interventions No flowsheet data found.

## 2022-04-30 IMAGING — CR DG CHEST 2V
2 series · 2 of 2 positions shown · non-contrast
Comparison: None

CLINICAL DATA: Wheezing and shortness of breath for 3 days, history
COPD

EXAM:
CHEST - 2 VIEW

[chest lat]
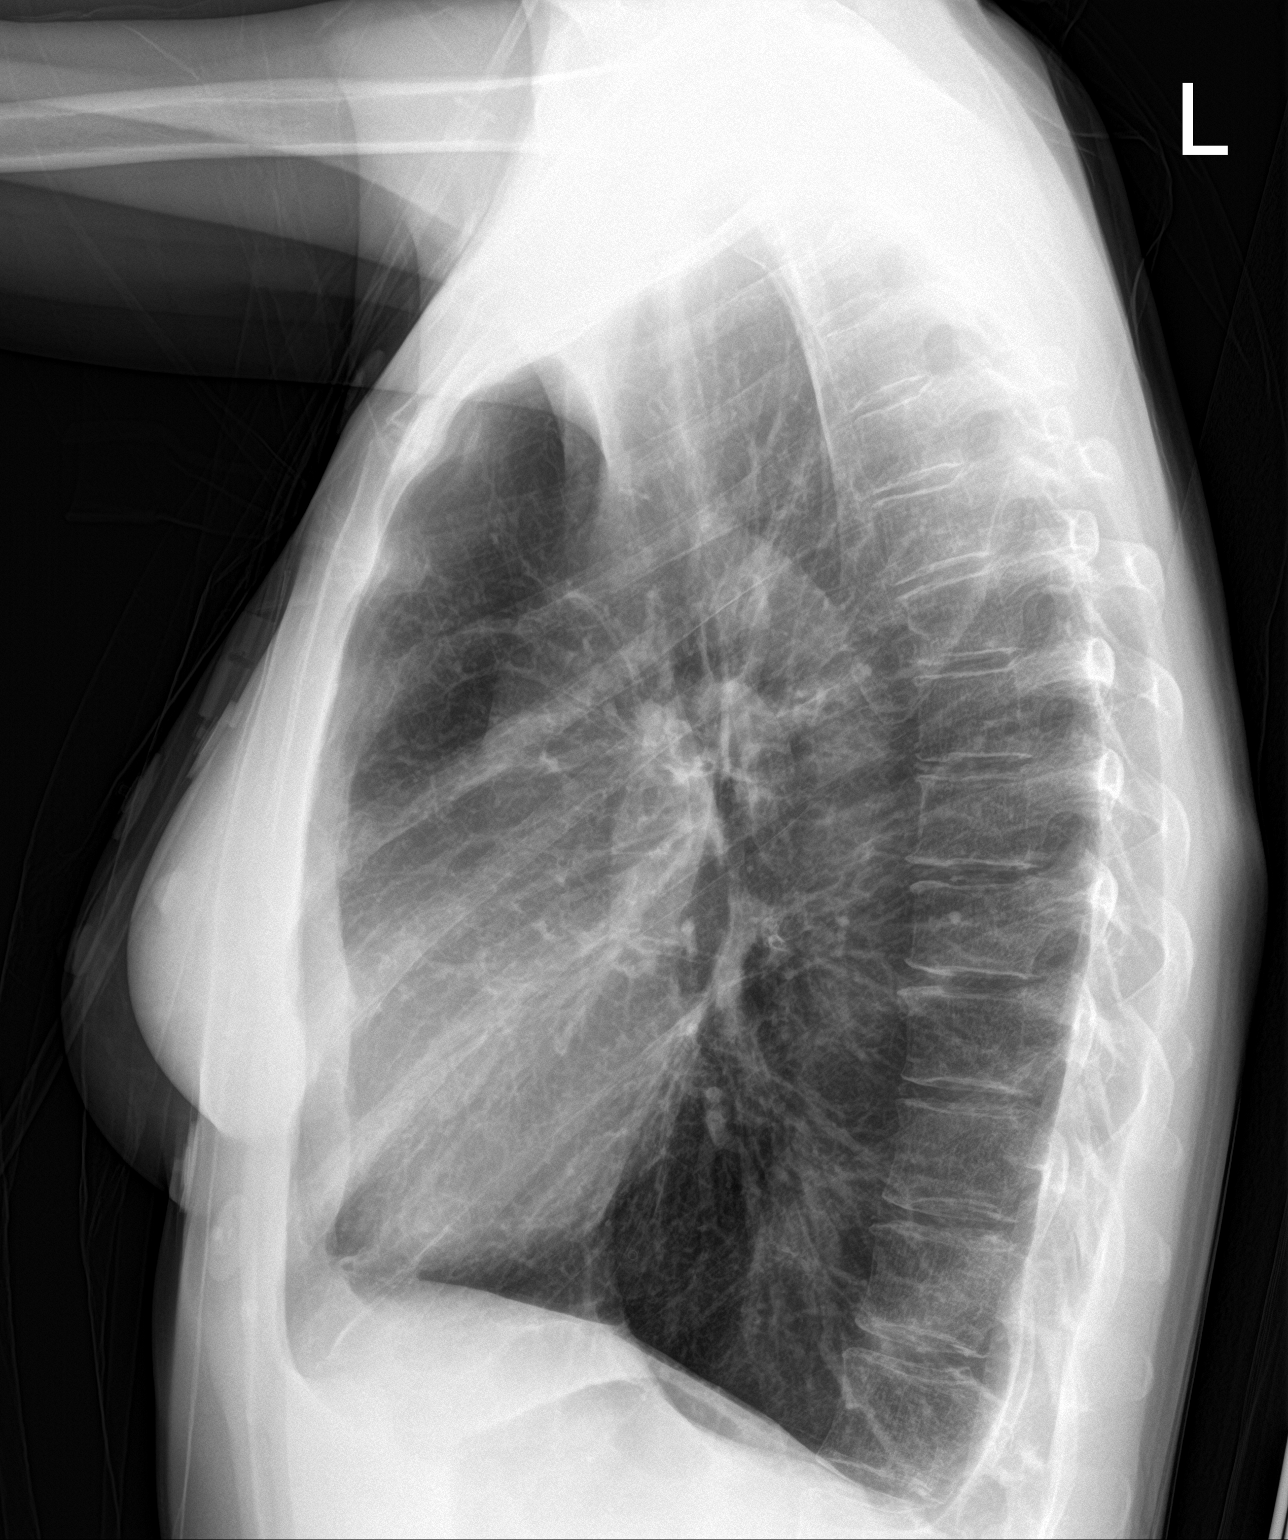

[chest ap]
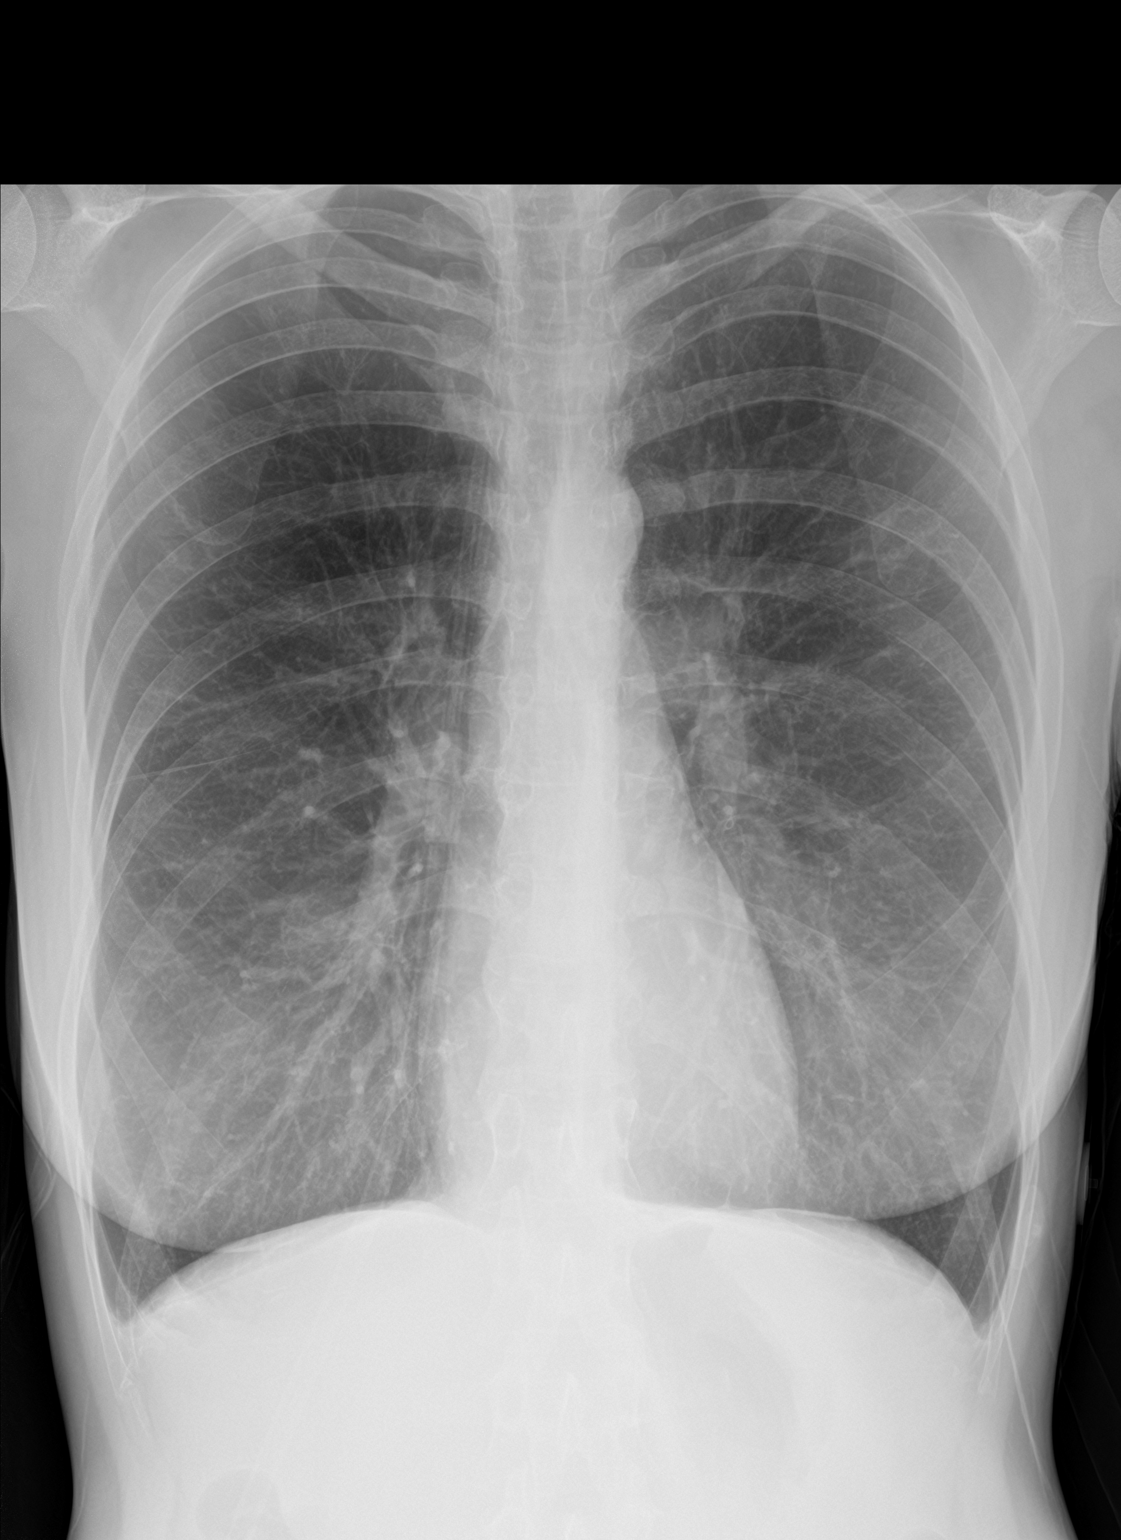

[2 of 2 positions shown; findings below may reference images not displayed]

FINDINGS: Normal heart size, mediastinal contours, and pulmonary vascularity.

Hyperinflation with mild emphysematous changes and minimal central
peribronchial thickening consistent with COPD.

No acute infiltrate, pleural effusion, or pneumothorax.

Osseous structures unremarkable.
IMPRESSION: COPD changes.

No acute abnormalities.

## 2023-06-10 ENCOUNTER — Emergency Department (HOSPITAL_BASED_OUTPATIENT_CLINIC_OR_DEPARTMENT_OTHER): Payer: Worker's Compensation

## 2023-06-10 ENCOUNTER — Encounter (HOSPITAL_BASED_OUTPATIENT_CLINIC_OR_DEPARTMENT_OTHER): Payer: Self-pay | Admitting: Emergency Medicine

## 2023-06-10 ENCOUNTER — Emergency Department (HOSPITAL_BASED_OUTPATIENT_CLINIC_OR_DEPARTMENT_OTHER)
Admission: EM | Admit: 2023-06-10 | Discharge: 2023-06-11 | Disposition: A | Discharge status: Home / Self Care | Payer: Worker's Compensation | Attending: Emergency Medicine | Admitting: Emergency Medicine

## 2023-06-10 DIAGNOSIS — Y990 Civilian activity done for income or pay: Secondary | ICD-10-CM | POA: Insufficient documentation

## 2023-06-10 DIAGNOSIS — S62632A Displaced fracture of distal phalanx of right middle finger, initial encounter for closed fracture: Secondary | ICD-10-CM | POA: Diagnosis not present

## 2023-06-10 DIAGNOSIS — W230XXA Caught, crushed, jammed, or pinched between moving objects, initial encounter: Secondary | ICD-10-CM | POA: Diagnosis not present

## 2023-06-10 DIAGNOSIS — S6991XA Unspecified injury of right wrist, hand and finger(s), initial encounter: Secondary | ICD-10-CM | POA: Diagnosis present

## 2023-06-10 MED ORDER — OXYCODONE-ACETAMINOPHEN 5-325 MG PO TABS
1.0000 | ORAL_TABLET | Freq: Once | ORAL | Status: AC
  Administered 2023-06-10: 1 via ORAL
  Filled 2023-06-10: qty 1

## 2023-06-10 NOTE — ED Triage Notes (Signed)
Right hand middle finger crushed by heavy object (road case) lac Happened around 8:40pm

## 2023-06-11 MED ORDER — KETOROLAC TROMETHAMINE 60 MG/2ML IM SOLN
30.0000 mg | Freq: Once | INTRAMUSCULAR | Status: AC
  Administered 2023-06-11: 30 mg via INTRAMUSCULAR
  Filled 2023-06-11: qty 2

## 2023-06-11 MED ORDER — HYDROMORPHONE HCL 1 MG/ML IJ SOLN
1.0000 mg | Freq: Once | INTRAMUSCULAR | Status: AC
  Administered 2023-06-11: 1 mg via INTRAMUSCULAR
  Filled 2023-06-11: qty 1

## 2023-06-11 MED ORDER — OXYCODONE-ACETAMINOPHEN 5-325 MG PO TABS: 1.0000 | ORAL_TABLET | Freq: Three times a day (TID) | ORAL | 0 refills | Status: AC | PRN

## 2023-06-12 NOTE — ED Provider Notes (Signed)
[facility] AT [facility], [place] Provider Note   CSN: [id number] Arrival date & time: 06/10/23  2116     History  Chief Complaint  Patient presents with   Finger Injury    Janet Pitts is a 47 y.o. female.  Patient had her right middle finger smashed between 2 boxes while at work causing a laceration, swelling, pain of her distal right middle finger.  Tetanus is up-to-date.  No other injuries.  Presents here for further evaluation.        Home Medications Prior to Admission medications   Medication Sig Start Date End Date Taking? Authorizing Provider  oxyCODONE-acetaminophen (PERCOCET) 5-325 MG tablet Take 1-2 tablets by mouth every 8 (eight) hours as needed for severe pain (pain score 7-10). 06/11/23  Yes Mckinsley Koelzer, Barbara Cower, MD  acetaminophen (TYLENOL) 500 MG tablet Take 1,000 mg by mouth every 6 (six) hours as needed for mild pain.    [provider]  albuterol (PROVENTIL) (2.5 MG/3ML) 0.083% nebulizer solution Take 3 mLs (2.5 mg total) by nebulization every 4 (four) hours as needed for wheezing or shortness of breath. 06/26/20 06/26/21  Danford, Earl Lites, MD  albuterol (VENTOLIN HFA) 108 (90 Base) MCG/ACT inhaler Inhale 2 puffs into the lungs every 6 (six) hours as needed for wheezing or shortness of breath.    [provider]  chlorpheniramine-HYDROcodone (TUSSIONEX) 10-8 MG/5ML SUER Take 5 mLs by mouth every 12 (twelve) hours as needed for cough. 06/26/20   Danford, Earl Lites, MD  guaiFENesin-dextromethorphan (ROBITUSSIN DM) 100-10 MG/5ML syrup Take 5 mLs by mouth every 4 (four) hours as needed for cough. 06/26/20   Danford, Earl Lites, MD  nicotine (NICODERM CQ - DOSED IN MG/24 HOURS) 21 mg/24hr patch Place 21 mg onto the skin daily.    [provider]  oseltamivir (TAMIFLU) 75 MG capsule Take 1 capsule (75 mg total) by mouth 2 (two) times daily. 06/26/20   Danford, Earl Lites, MD  predniSONE (DELTASONE) 20 MG  tablet Take 2 tablets (40 mg total) by mouth daily with breakfast. 06/26/20   Danford, Earl Lites, MD      Allergies    Patient has no known allergies.    Review of Systems   Review of Systems  Physical Exam Updated Vital Signs BP 118/85   Pulse 60   Temp 98 F (36.7 C)   Resp 18   SpO2 98%  Physical Exam Vitals and nursing note reviewed.  Constitutional:      Appearance: She is well-developed.  HENT:     Head: Normocephalic and atraumatic.     Mouth/Throat:     Mouth: Mucous membranes are moist.  Eyes:     Pupils: Pupils are equal, round, and reactive to light.  Cardiovascular:     Rate and Rhythm: Normal rate and regular rhythm.  Pulmonary:     Effort: No respiratory distress.     Breath sounds: No stridor.  Abdominal:     General: There is no distension.  Musculoskeletal:     Cervical back: Normal range of motion.     Comments: Right middle finger is approximately twice the size of her left.  She does have a laceration approximately 1.5 cm on the dorsal surface no nailbed injury.  Does have ecchymosis.  Neurological:     General: No focal deficit present.     Mental Status: She is alert.     ED Results / Procedures / Treatments   Labs (all labs ordered are listed,  but only abnormal results are displayed) Labs Reviewed - No data to display  EKG None  Radiology DG Finger Middle Right  Result Date: 06/10/2023 CLINICAL DATA:  injury Smashed distal finger at work. EXAM: RIGHT MIDDLE FINGER 2+V COMPARISON:  None Available. FINDINGS: Minimally comminuted and displaced distal tuft fracture. No intra-articular involvement. Proximal digit is intact. No soft tissue gas or radiopaque foreign body. IMPRESSION: Minimally comminuted and displaced distal tuft fracture. Electronically Signed   By: Narda Rutherford M.D.   On: 06/10/2023 23:06    Procedures Procedures    Medications Ordered in ED Medications  oxyCODONE-acetaminophen (PERCOCET/ROXICET) 5-325 MG per  tablet 1 tablet (1 tablet Oral Given 06/10/23 2143)  HYDROmorphone (DILAUDID) injection 1 mg (1 mg Intramuscular Given 06/11/23 0129)  ketorolac (TORADOL) injection 30 mg (30 mg Intramuscular Given 06/11/23 0128)    ED Course/ Medical Decision Making/ A&P                                 Medical Decision Making Amount and/or Complexity of Data Reviewed Radiology: ordered.  Risk Prescription drug management.   X-ray reviewed interpreted by myself as a distal tuft fracture comminuted.  Right middle finger is approximately twice the size of her left.  She does have a laceration there but with how edematous her finger is and the crush injury is very unlikely to be able to get it closed primarily.  Her tetanus is up-to-date.  Pain medication provided.  Will refer to hand surgery for further management of the distal fracture.  She has a laceration in that area but once again this appears to be related to the crush injury and not a true open fracture.  No indication for antibiotics at this time. Wound care per nursing, static finger splint placed for stability, protection and comfort.  Patient stable for discharge.    Final Clinical Impression(s) / ED Diagnoses Final diagnoses:  Closed displaced fracture of distal phalanx of right middle finger, initial encounter    Rx / DC Orders ED Discharge Orders          Ordered    oxyCODONE-acetaminophen (PERCOCET) 5-325 MG tablet  Every 8 hours PRN        06/11/23 0246              Jakhia Buxton, Barbara Cower, MD 06/12/23 [phone]
